# Patient Record
Sex: Male | Born: 1960 | Race: White | Hispanic: No | Marital: Married | Smoking: Former smoker
Health system: Southern US, Community
[De-identification: ages and names within clinical notes are randomized; demographics above are authoritative.]

## PROBLEM LIST (undated history)

## (undated) DIAGNOSIS — I10 Essential (primary) hypertension: Secondary | ICD-10-CM

## (undated) DIAGNOSIS — E538 Deficiency of other specified B group vitamins: Secondary | ICD-10-CM

## (undated) DIAGNOSIS — K219 Gastro-esophageal reflux disease without esophagitis: Secondary | ICD-10-CM

## (undated) DIAGNOSIS — E7800 Pure hypercholesterolemia, unspecified: Secondary | ICD-10-CM

## (undated) HISTORY — PX: TONSILLECTOMY: SUR1361

## (undated) HISTORY — PX: JOINT REPLACEMENT: SHX530

## (undated) HISTORY — PX: TOTAL HIP ARTHROPLASTY: SHX124

---

## 2005-10-25 ENCOUNTER — Ambulatory Visit: Payer: Self-pay | Admitting: Internal Medicine

## 2005-12-02 ENCOUNTER — Ambulatory Visit: Payer: Self-pay | Admitting: Psychiatry

## 2006-02-10 ENCOUNTER — Inpatient Hospital Stay: Payer: Self-pay | Admitting: General Practice

## 2007-01-31 ENCOUNTER — Ambulatory Visit: Payer: Self-pay | Admitting: Gastroenterology

## 2010-03-29 HISTORY — PX: CHOLECYSTECTOMY: SHX55

## 2010-04-29 ENCOUNTER — Ambulatory Visit: Payer: Self-pay

## 2010-05-06 ENCOUNTER — Ambulatory Visit: Payer: Self-pay | Admitting: Anesthesiology

## 2010-05-13 ENCOUNTER — Ambulatory Visit: Payer: Self-pay | Admitting: Surgery

## 2010-05-18 LAB — PATHOLOGY REPORT

## 2011-12-17 ENCOUNTER — Ambulatory Visit: Payer: Self-pay | Admitting: Family Medicine

## 2013-08-12 ENCOUNTER — Observation Stay: Payer: Self-pay | Admitting: Internal Medicine

## 2013-08-12 LAB — BASIC METABOLIC PANEL
ANION GAP: 5 — AB (ref 7–16)
BUN: 9 mg/dL (ref 7–18)
Calcium, Total: 8.9 mg/dL (ref 8.5–10.1)
Chloride: 109 mmol/L — ABNORMAL HIGH (ref 98–107)
Co2: 26 mmol/L (ref 21–32)
Creatinine: 0.94 mg/dL (ref 0.60–1.30)
EGFR (African American): >60
Glucose: 129 mg/dL — ABNORMAL HIGH (ref 65–99)
Osmolality: 280 (ref 275–301)
Potassium: 3.7 mmol/L (ref 3.5–5.1)
Sodium: 140 mmol/L (ref 136–145)

## 2013-08-12 LAB — CBC
HCT: 47.7 % (ref 40.0–52.0)
HGB: 16.1 g/dL (ref 13.0–18.0)
MCH: 30.3 pg (ref 26.0–34.0)
MCHC: 33.7 g/dL (ref 32.0–36.0)
MCV: 90 fL (ref 80–100)
Platelet: 211 10*3/uL (ref 150–440)
RBC: 5.31 10*6/uL (ref 4.40–5.90)
RDW: 13.5 % (ref 11.5–14.5)
WBC: 6.1 10*3/uL (ref 3.8–10.6)

## 2013-08-12 LAB — TROPONIN I
Troponin-I: <0.02 ng/mL
Troponin-I: <0.02 ng/mL

## 2013-08-12 LAB — HEPATIC FUNCTION PANEL A (ARMC)
ALK PHOS: 84 U/L
ALT: 83 U/L — AB (ref 12–78)
Albumin: 3.6 g/dL (ref 3.4–5.0)
Bilirubin, Direct: 0.1 mg/dL (ref 0.00–0.20)
Bilirubin,Total: 0.4 mg/dL (ref 0.2–1.0)
SGOT(AST): 42 U/L — ABNORMAL HIGH (ref 15–37)
TOTAL PROTEIN: 6.8 g/dL (ref 6.4–8.2)

## 2013-08-12 LAB — LIPASE, BLOOD: Lipase: 111 U/L (ref 73–393)

## 2013-08-12 LAB — CK-MB
CK-MB: 0.9 ng/mL (ref 0.5–3.6)
CK-MB: 0.7 ng/mL (ref 0.5–3.6)

## 2013-08-13 LAB — BASIC METABOLIC PANEL
Anion Gap: 1 — ABNORMAL LOW (ref 7–16)
BUN: 11 mg/dL (ref 7–18)
CALCIUM: 8.8 mg/dL (ref 8.5–10.1)
CO2: 32 mmol/L (ref 21–32)
Chloride: 106 mmol/L (ref 98–107)
Creatinine: 0.99 mg/dL (ref 0.60–1.30)
EGFR (African American): >60
EGFR (Non-African Amer.): >60
GLUCOSE: 95 mg/dL (ref 65–99)
Osmolality: 277 (ref 275–301)
POTASSIUM: 4.1 mmol/L (ref 3.5–5.1)
Sodium: 139 mmol/L (ref 136–145)

## 2013-08-13 LAB — CBC WITH DIFFERENTIAL/PLATELET
BASOS ABS: 0.1 10*3/uL (ref 0.0–0.1)
BASOS PCT: 0.8 %
EOS ABS: 0.3 10*3/uL (ref 0.0–0.7)
EOS PCT: 3.5 %
HCT: 43.8 % (ref 40.0–52.0)
HGB: 14.7 g/dL (ref 13.0–18.0)
LYMPHS ABS: 2.8 10*3/uL (ref 1.0–3.6)
LYMPHS PCT: 33.7 %
MCH: 30.2 pg (ref 26.0–34.0)
MCHC: 33.6 g/dL (ref 32.0–36.0)
MCV: 90 fL (ref 80–100)
Monocyte #: 0.8 x10 3/mm (ref 0.2–1.0)
Monocyte %: 10 %
Neutrophil #: 4.4 10*3/uL (ref 1.4–6.5)
Neutrophil %: 52 %
PLATELETS: 193 10*3/uL (ref 150–440)
RBC: 4.87 10*6/uL (ref 4.40–5.90)
RDW: 13.5 % (ref 11.5–14.5)
WBC: 8.4 10*3/uL (ref 3.8–10.6)

## 2014-07-20 NOTE — Discharge Summary (Signed)
PATIENT NAME:  Anthony Boyle, Anthony Boyle MR#:  [id number] DATE OF BIRTH:  [birth year]  DATE OF ADMISSION:  08/12/2013 DATE OF DISCHARGE:  08/13/2013  DISCHARGE DIAGNOSES: 1.  Atypical chest pain.  2.  Hypertensive urgency.  3.  Hyperlipidemia.  4.  Reflux esophagitis.   DISCHARGE MEDICATIONS: Simvastatin 40 mg at bedtime, Flonase 2 sprays daily, amlodipine 5 mg b.i.d., omeprazole 20 mg b.i.d., aspirin 81 mg daily.   REASON FOR ADMISSION: This 54 year old male presents with chest pain and elevated blood pressure. Please see H and P for HPI, past medical history, and physical exam.   HOSPITAL COURSE: The patient was admitted. Enzymes were negative. Lexiscan Myoview showed no signs of ischemia with normal ejection fraction. He was asymptomatic. He has been under a lot of stress lately at work. His systolic pressure has been running 160 to 170. The b.i.d. Norvasc has brought that down. He has been drinking about the same amount of alcohol. He will be doubling up the omeprazole and doubling up the amlodipine, to follow up with Dr. Sabra Heck in one week.   ____________________________ Rusty Aus, MD mfm:jcm D: 08/13/2013 13:53:48 ET T: 08/13/2013 15:28:09 ET JOB#: [id number]  cc: Rusty Aus, MD, <Dictator> MARK Roselee Culver MD ELECTRONICALLY SIGNED 08/14/2013 8:05

## 2014-07-20 NOTE — H&P (Signed)
PATIENT NAME:  Anthony Boyle, Anthony Boyle MR#:  [id number] DATE OF BIRTH:  Jul 23, 1960  DATE OF ADMISSION:  08/12/2013  REFERRING PHYSICIAN: Sheryl L. Benjaman Lobe, MD  FAMILY PHYSICIAN: Dr. [name].   REASON FOR ADMISSION: Chest pain.   HISTORY OF PRESENT ILLNESS: The patient is a 54 year old male with a history of benign hypertension, hyperlipidemia, reflux, previous hip surgery who presents to the Emergency Room with chest pain lasting an hour associated with weakness. His pain was described as tightness. It was nonradiating. No shortness of breath, nausea or palpitations. In the Emergency Room, the patient was given aspirin with improvement of his pain. EKG and initial cardiac enzymes are negative. He is now admitted for further evaluation.   PAST MEDICAL HISTORY: 1.  Benign hypertension.  2.  Hyperlipidemia.  3.  GE reflux disease.  4.  Environmental allergies.  5.  Status post left hip surgery.   MEDICATIONS: 1.  Simvastatin 40 mg p.o. daily.  2.  Omeprazole 20 mg p.o. daily.  3.  Flonase 2 puffs in each nostril daily.  4.  Norvasc 5 mg p.o. daily.   ALLERGIES: No known drug allergies.   SOCIAL HISTORY: The patient quit smoking over 10 years ago. No history of alcohol abuse.   FAMILY HISTORY: Noncontributory. Specifically negative for coronary artery disease, colon cancer or prostate cancer.   REVIEW OF SYSTEMS:    CONSTITUTIONAL: No fever or change in weight.  EYES: No blurred or double vision. No glaucoma.  ENT: No tinnitus or hearing loss. No nasal discharge or bleeding. No difficulty swallowing.  RESPIRATORY: No cough or wheezing. Denies hemoptysis.  CARDIOVASCULAR: No palpitations or syncope. No orthopnea.  GASTROINTESTINAL: No nausea, vomiting or diarrhea. No abdominal pain. No change in bowel habits.  GENITOURINARY: No dysuria or hematuria. No incontinence.  ENDOCRINE:  No polyuria or polydipsia.  No heat or cold intolerance.   HEMATOLOGIC:  The patient denies anemia, easy  bruising or bleeding. LYMPHATIC:  No swollen lands.   MUSCULOSKELETAL:  The patient denies pain in his neck, back, shoulders, knees or hips.  No gout.   NEUROLOGIC:  No numbness or migraines.  Denies stroke or seizures.   PSYCHIATRIC:  The patient denies anxiety, insomnia or depression.    PHYSICAL EXAMINATION: GENERAL:  The patient is anxious but in no acute distress.  VITAL SIGNS:  Remarkable for blood pressure 154/88, heart rate 77, respiratory rate 18, temperature 98, sat 98% on room air. HEENT:  Normocephalic, atraumatic.  Pupils equal, round and reactive to light and accommodation.  Extraocular movements are intact.  sclerae are anicteric.  Conjunctivae are clear.  Oropharynx is clear.   NECK:  Supple without JVD.  No lymphadenopathy or thyromegaly was noted. LUNGS:  Clear to auscultation and percussion without wheezing, rales, rhonchi.  No dullness.  Respiratory effort is normal.   CARDIAC EXAM:  Revealed a regular rate and rhythm with normal S1 and S2.  No significant rubs, murmurs or gallops.  PMI is nondisplaced.  Chest wall is nontender.   ABDOMEN:  Soft, nontender with normoactive bowel sounds.  No organomegaly or masses were appreciated.  No hernias or bruits were noted.   EXTREMITIES:  Without clubbing, cyanosis or edema.  Pulses were 2+ bilaterally.   SKIN:  Warm and dry without rash or lesions. NEUROLOGICAL EXAMINATION:  Cranial nerves II through XII grossly intact.  Deep tendon reflexes were symmetric.  Motor and sensory exams nonfocal. PSYCHIATRIC:  Revealed a patient who was alert and oriented to person, place  and time. He was cooperative and used good judgment.   LABORATORY, DIAGNOSTIC AND RADIOLOGICAL DATA:  EKG revealed sinus rhythm with no acute ischemic changes. Chest x-ray was negative. White count was 6.1 with a hemoglobin of 16.1. Glucose 129 with a BUN of 9, creatinine 0.94 and a GFR of greater than 60. Sodium 140 with a potassium of 3.7. Lipase 111. Troponin less than  0.02.   ASSESSMENT: 1.  Chest pain, worrisome for unstable angina.  2.  Benign hypertension.  3.  Hyperlipidemia.  4.  Anxiety.  5.  Hyperglycemia.  6.  Gastroesophageal reflux disease.    PLAN: The patient will be observed on telemetry with oral nitrates, aspirin, Lovenox and continued Norvasc. We will follow serial cardiac enzymes. Assuming his enzymes remain normal, we will proceed with stress Myoview testing in the morning. Further treatment and evaluation will depend upon the patient's progress.   TOTAL TIME SPENT ON THIS PATIENT: 45 minutes.   ____________________________ Leonie Douglas Doy Hutching, MD jds:cs D: 08/12/2013 14:52:02 ET T: 08/12/2013 14:59:42 ET JOB#: [id number]  cc: Leonie Douglas. Doy Hutching, MD, <Dictator> Rusty Aus, MD Pranav Lennice Sites MD ELECTRONICALLY SIGNED 08/12/2013 16:56

## 2015-07-24 IMAGING — CR DG CHEST 1V PORT
1 series · 1 of 1 positions shown · non-contrast
Comparison: None.

CLINICAL DATA: Chest pain

EXAM:
PORTABLE CHEST - 1 VIEW

[ap]
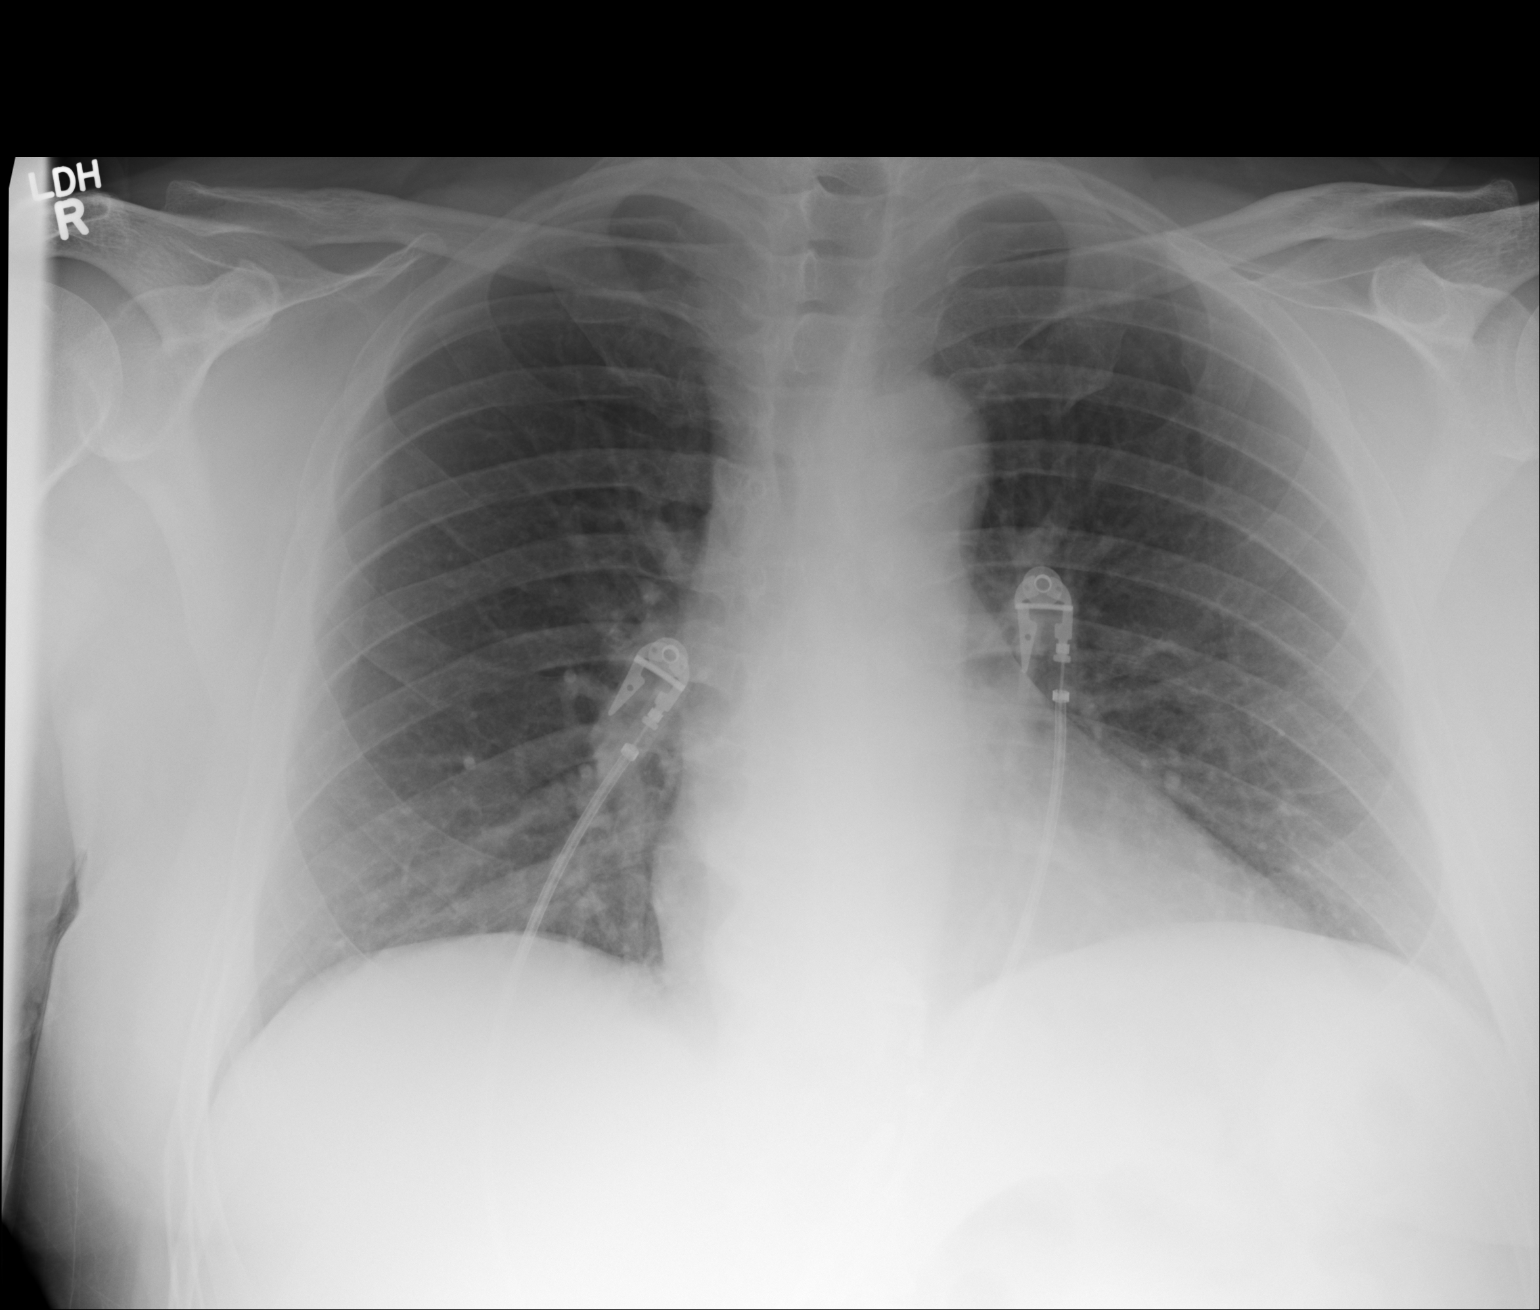

[1 of 1 positions shown; findings below may reference images not displayed]

FINDINGS: The heart size and mediastinal contours are within normal limits.
Both lungs are clear. The visualized skeletal structures are
unremarkable. Lungs are hypoaerated with crowding of the
bronchovascular markings. Cardiac leads obscure detail.
IMPRESSION: No active disease.

## 2016-09-23 ENCOUNTER — Emergency Department
Admission: EM | Admit: 2016-09-23 | Discharge: 2016-09-23 | Disposition: A | Discharge status: Home / Self Care | Payer: BLUE CROSS/BLUE SHIELD | Attending: Emergency Medicine | Admitting: Emergency Medicine

## 2016-09-23 ENCOUNTER — Encounter: Payer: Self-pay | Admitting: Emergency Medicine

## 2016-09-23 DIAGNOSIS — I10 Essential (primary) hypertension: Secondary | ICD-10-CM | POA: Diagnosis not present

## 2016-09-23 DIAGNOSIS — R42 Dizziness and giddiness: Secondary | ICD-10-CM

## 2016-09-23 DIAGNOSIS — R202 Paresthesia of skin: Secondary | ICD-10-CM | POA: Diagnosis not present

## 2016-09-23 DIAGNOSIS — E860 Dehydration: Secondary | ICD-10-CM | POA: Diagnosis not present

## 2016-09-23 DIAGNOSIS — Z87891 Personal history of nicotine dependence: Secondary | ICD-10-CM | POA: Insufficient documentation

## 2016-09-23 HISTORY — DX: Pure hypercholesterolemia, unspecified: E78.00

## 2016-09-23 HISTORY — DX: Gastro-esophageal reflux disease without esophagitis: K21.9

## 2016-09-23 HISTORY — DX: Essential (primary) hypertension: I10

## 2016-09-23 LAB — CBC
HCT: 45.6 % (ref 40.0–52.0)
Hemoglobin: 15.8 g/dL (ref 13.0–18.0)
MCH: 30.5 pg (ref 26.0–34.0)
MCHC: 34.6 g/dL (ref 32.0–36.0)
MCV: 88 fL (ref 80.0–100.0)
PLATELETS: 172 10*3/uL (ref 150–440)
RBC: 5.18 MIL/uL (ref 4.40–5.90)
RDW: 14.1 % (ref 11.5–14.5)
WBC: 7.3 10*3/uL (ref 3.8–10.6)

## 2016-09-23 LAB — URINALYSIS, COMPLETE (UACMP) WITH MICROSCOPIC
BILIRUBIN URINE: NEGATIVE
Bacteria, UA: NONE SEEN
Glucose, UA: NEGATIVE mg/dL
HGB URINE DIPSTICK: NEGATIVE
Ketones, ur: NEGATIVE mg/dL
LEUKOCYTES UA: NEGATIVE
NITRITE: NEGATIVE
PROTEIN: NEGATIVE mg/dL
RBC / HPF: NONE SEEN RBC/hpf (ref 0–5)
Specific Gravity, Urine: 1.002 — ABNORMAL LOW (ref 1.005–1.030)
WBC, UA: NONE SEEN WBC/hpf (ref 0–5)
pH: 6 (ref 5.0–8.0)

## 2016-09-23 LAB — TROPONIN I: Troponin I: <0.03 ng/mL (ref <0.03)

## 2016-09-23 LAB — BASIC METABOLIC PANEL
ANION GAP: 6 (ref 5–15)
BUN: 8 mg/dL (ref 6–20)
CALCIUM: 9.6 mg/dL (ref 8.9–10.3)
CO2: 27 mmol/L (ref 22–32)
CREATININE: 0.96 mg/dL (ref 0.61–1.24)
Chloride: 106 mmol/L (ref 101–111)
Glucose, Bld: 99 mg/dL (ref 65–99)
Potassium: 4.1 mmol/L (ref 3.5–5.1)
SODIUM: 139 mmol/L (ref 135–145)

## 2016-09-23 NOTE — Discharge Instructions (Signed)
Stay hydrated with gatorade. Try to stay out of the sun as much as possible.   You may have a pinch nerve in your neck. Avoid heavy lifting. Take motrin as needed for pain.   See your doctor in a week   Return to ER if you have worse numbness, weakness, dizziness, worsening rash, fever, vomiting.

## 2016-09-23 NOTE — ED Provider Notes (Signed)
[facility] Provider Note   CSN: [id number] Arrival date & time: 09/23/16  1043     History   Chief Complaint Chief Complaint  Patient presents with  . Tingling    HPI Anthony Boyle is a 56 y.o. male history of hypertension, reflux here presenting with dizziness, right arm tingling. Patient states that he was at the beach last week and hasn't been hydrated very well so sometimes get lightheaded and dizzy. 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 About 5 days ago, patient had a tick on the left buttock that he removed but didn't know how long the tick was on him. Over the last for 5 days he has intermittent dizziness as well as occasional paresthesias down the right arm. Denies any right arm weakness or slurred speech or leg weakness. Patient states that he has been drinking water and it helped with his symptoms and now he uses Gatorade and feels better. He shouldn't states that he gets tick bites multiple times a year and denies any expanding rash. Denies any fevers at home.  The history is provided by the patient.    Past Medical History:  Diagnosis Date  . Acid reflux   . Hypercholesteremia   . Hypertension     There are no active problems to display for this patient.   Past Surgical History:  Procedure Laterality Date  . TOTAL HIP ARTHROPLASTY         Home Medications    Prior to Admission medications   Not on File    Family History History reviewed. No pertinent family history.  Social History Social History  Substance Use Topics  . Smoking status: Former Research scientist (life sciences)  . Smokeless tobacco: Never Used  . Alcohol use Yes     Allergies   Patient has no known allergies.   Review of Systems Review of Systems  Neurological: Positive for dizziness and numbness.  All other systems reviewed and are negative.    Physical Exam Updated Vital Signs BP (!) 164/91 (BP Location: Left Arm)   Pulse 80   Temp 97.7 F (36.5 C)  (Oral)   Resp 16   Ht 5\' 9"  (1.753 m)   Wt 101.6 kg (224 lb)   SpO2 96%   BMI 33.08 kg/m   Physical Exam  Constitutional: He is oriented to person, place, and time. He appears well-developed and well-nourished.  HENT:  Head: Normocephalic.  Mouth/Throat: Oropharynx is clear and moist.  Eyes: Conjunctivae and EOM are normal. Pupils are equal, round, and reactive to light.  Neck: Normal range of motion. Neck supple.  No midline cervical or paracervical tenderness or deformity   Cardiovascular: Normal rate, regular rhythm and normal heart sounds.   Pulmonary/Chest: Effort normal and breath sounds normal. No respiratory distress. He has no wheezes.  Abdominal: Soft. Bowel sounds are normal. He exhibits no distension. There is no tenderness.  Musculoskeletal: Normal range of motion. He exhibits no edema or deformity.  Neurological: He is alert and oriented to person, place, and time.  CN 2-12 intact. Nl strength and sensation throughout. Nl gait   Skin: Skin is warm.  L buttock area with small rash, no central clearing. No obvious cellulitis.   Psychiatric: He has a normal mood and affect.  Nursing note and vitals reviewed.    ED Treatments / Results  Labs (all labs ordered are listed, but only abnormal results are displayed) Labs Reviewed  URINALYSIS, COMPLETE (UACMP) WITH MICROSCOPIC - Abnormal;  Notable for the following:       Result Value   Color, Urine STRAW (*)    APPearance CLEAR (*)    Specific Gravity, Urine 1.002 (*)    Squamous Epithelial / LPF 0-5 (*)    All other components within normal limits  BASIC METABOLIC PANEL  CBC  TROPONIN I    EKG  EKG Interpretation  Date/Time:  Thursday September 23 2016 11:25:00 EDT Ventricular Rate:  64 PR Interval:  170 QRS Duration: 84 QT Interval:  412 QTC Calculation: 425 R Axis:   -16 Text Interpretation:  Normal sinus rhythm Normal ECG When compared with ECG of 12-Aug-2013 10:46, No significant change was found Confirmed  by Wandra Arthurs [phone]) on 09/23/2016 1:05:34 PM       Radiology No results found.  Procedures Procedures (including critical care time)  Medications Ordered in ED Medications - No data to display   Initial Impression / Assessment and Plan / ED Course  I have reviewed the triage vital signs and the nursing notes.  Pertinent labs & imaging results that were available during my care of the patient were reviewed by me and considered in my medical decision making (see chart for details).     Anthony Boyle is a 56 y.o. male here with dizziness, R arm paresthesia. Nl neuro exam now. Appears mildly dehydrated. Borderline orthostatic. I think symptoms likely from mild dehydration vs cervical radiculopathy. No signs of stroke and neurologically intact. Of note, he did have a tick bite but no signs but I doubt RMSF or lyme disease rash. Labs unremarkable and UA showed no ketones. Recommend increase hydration with gatorade. Gave strict return precautions.    Final Clinical Impressions(s) / ED Diagnoses   Final diagnoses:  None    New Prescriptions New Prescriptions   No medications on file     Drenda Freeze, MD 09/23/16 1321

## 2016-09-23 NOTE — ED Triage Notes (Signed)
Pt c/o right arm tingling and numbness intermittent for 1 week.  Did remember pulling tick off him last week. No decraesed sensation noted in triage today.  Ambulatory to triage.  Has also been feeling lightheaded, mostly with change of position.  VSS

## 2018-02-14 ENCOUNTER — Encounter: Payer: Self-pay | Admitting: *Deleted

## 2018-02-15 ENCOUNTER — Encounter: Payer: Self-pay | Admitting: *Deleted

## 2018-02-15 ENCOUNTER — Encounter
Admission: RE | Disposition: A | Discharge status: Home / Self Care | Payer: Self-pay | Source: Ambulatory Visit | Attending: Internal Medicine

## 2018-02-15 ENCOUNTER — Ambulatory Visit: Payer: BLUE CROSS/BLUE SHIELD | Admitting: Certified Registered Nurse Anesthetist

## 2018-02-15 ENCOUNTER — Ambulatory Visit
Admission: RE | Admit: 2018-02-15 | Discharge: 2018-02-15 | Disposition: A | Discharge status: Home / Self Care | Payer: BLUE CROSS/BLUE SHIELD | Source: Ambulatory Visit | Attending: Internal Medicine | Admitting: Internal Medicine

## 2018-02-15 ENCOUNTER — Other Ambulatory Visit: Payer: Self-pay

## 2018-02-15 DIAGNOSIS — Z7951 Long term (current) use of inhaled steroids: Secondary | ICD-10-CM | POA: Insufficient documentation

## 2018-02-15 DIAGNOSIS — Z79899 Other long term (current) drug therapy: Secondary | ICD-10-CM | POA: Insufficient documentation

## 2018-02-15 DIAGNOSIS — K219 Gastro-esophageal reflux disease without esophagitis: Secondary | ICD-10-CM | POA: Insufficient documentation

## 2018-02-15 DIAGNOSIS — Z87891 Personal history of nicotine dependence: Secondary | ICD-10-CM | POA: Diagnosis not present

## 2018-02-15 DIAGNOSIS — Z7982 Long term (current) use of aspirin: Secondary | ICD-10-CM | POA: Diagnosis not present

## 2018-02-15 DIAGNOSIS — R195 Other fecal abnormalities: Secondary | ICD-10-CM | POA: Diagnosis present

## 2018-02-15 DIAGNOSIS — E538 Deficiency of other specified B group vitamins: Secondary | ICD-10-CM | POA: Diagnosis not present

## 2018-02-15 DIAGNOSIS — E7800 Pure hypercholesterolemia, unspecified: Secondary | ICD-10-CM | POA: Insufficient documentation

## 2018-02-15 DIAGNOSIS — D123 Benign neoplasm of transverse colon: Secondary | ICD-10-CM | POA: Insufficient documentation

## 2018-02-15 DIAGNOSIS — I10 Essential (primary) hypertension: Secondary | ICD-10-CM | POA: Diagnosis not present

## 2018-02-15 DIAGNOSIS — D125 Benign neoplasm of sigmoid colon: Secondary | ICD-10-CM | POA: Diagnosis not present

## 2018-02-15 DIAGNOSIS — K640 First degree hemorrhoids: Secondary | ICD-10-CM | POA: Diagnosis not present

## 2018-02-15 DIAGNOSIS — Z96642 Presence of left artificial hip joint: Secondary | ICD-10-CM | POA: Insufficient documentation

## 2018-02-15 HISTORY — PX: COLONOSCOPY WITH PROPOFOL: SHX5780

## 2018-02-15 HISTORY — DX: Deficiency of other specified B group vitamins: E53.8

## 2018-02-15 SURGERY — COLONOSCOPY WITH PROPOFOL
Anesthesia: General

## 2018-02-15 MED ORDER — PROPOFOL 10 MG/ML IV BOLUS
INTRAVENOUS | Status: DC | PRN
  Administered 2018-02-15: 80 mg via INTRAVENOUS

## 2018-02-15 MED ORDER — SODIUM CHLORIDE 0.9 % IV SOLN
INTRAVENOUS | Status: DC
  Administered 2018-02-15: 11:00:00 via INTRAVENOUS

## 2018-02-15 MED ORDER — PROPOFOL 500 MG/50ML IV EMUL
INTRAVENOUS | Status: DC | PRN
  Administered 2018-02-15: 160 ug/kg/min via INTRAVENOUS

## 2018-02-15 NOTE — Anesthesia Preprocedure Evaluation (Signed)
Anesthesia Evaluation  Patient identified by MRN, date of birth, ID band Patient awake    Reviewed: Allergy & Precautions, H&P , NPO status , reviewed documented beta blocker date and time   Airway Mallampati: II   Neck ROM: full    Dental  (+) Chipped   Pulmonary former smoker,    Pulmonary exam normal        Cardiovascular hypertension, Normal cardiovascular exam  Summary   1. No significant wall motion abnormality noted.   2. Pharmacological myocardial perfusion study with no significant  ischemia.   3. The estimated ejection fraction is 67%.   4. The left ventricular global function was normal.   5. There are no EKG changes concerning for ischemia.   6. There is no artifact noted on this study.  Diagnosing Physician: [id number] Serafina Royals MD  Electronically signed at 1:20:53 PM on 08/13/2013    Neuro/Psych    GI/Hepatic GERD  Medicated and Controlled,  Endo/Other    Renal/GU      Musculoskeletal   Abdominal   Peds  Hematology   Anesthesia Other Findings Past Medical History: No date: Acid reflux No date: B12 deficiency No date: Hypercholesteremia No date: Hypertension  Past Surgical History: 2012: CHOLECYSTECTOMY No date: JOINT REPLACEMENT; Left     Comment:  replacement total hip with resurfacing implants No date: TONSILLECTOMY No date: TOTAL HIP ARTHROPLASTY  BMI    Body Mass Index:  33.97 kg/m      Reproductive/Obstetrics                             Anesthesia Physical Anesthesia Plan  ASA: II  Anesthesia Plan: General   Post-op Pain Management:    Induction: Intravenous  PONV Risk Score and Plan: 2 and Treatment may vary due to age or medical condition and TIVA  Airway Management Planned: Nasal Cannula and Natural Airway  Additional Equipment:   Intra-op Plan:   Post-operative Plan:   Informed Consent: I have reviewed the patients History and Physical,  chart, labs and discussed the procedure including the risks, benefits and alternatives for the proposed anesthesia with the patient or authorized representative who has indicated his/her understanding and acceptance.   Dental Advisory Given  Plan Discussed with: CRNA  Anesthesia Plan Comments:         Anesthesia Quick Evaluation

## 2018-02-15 NOTE — H&P (Signed)
  Outpatient short stay form Pre-procedure 02/15/2018 11:01 AM Anthony Boyle, M.D.  Primary Physician: [name], MD  Reason for visit: Positive Cologuard stool testing  History of present illness: Patient is a pleasant 57 year old male presenting for colorectal cancer screening due to a positive stool Cologuard test.  The patient denies any abdominal pain, change in bowel habits or rectal bleeding.  Last colonoscopy 2008 with Dr. Rhona Leavens revealed internal hemorrhoids but no intracolonic neoplasms.    Current Facility-Administered Medications:  .  0.9 %  sodium chloride infusion, , Intravenous, Continuous, [place], Benay Pike, MD, Last Rate: 20 mL/hr at 02/15/18 1034  Medications Prior to Admission  Medication Sig Dispense Refill Last Dose  . amLODipine (NORVASC) 10 MG tablet Take 10 mg by mouth daily.   02/14/2018 at 1900  . aspirin EC 81 MG tablet Take 81 mg by mouth daily.   Past Week at Unknown time  . fluticasone (FLONASE) 50 MCG/ACT nasal spray Place 2 sprays into both nostrils daily.   Past Week at Unknown time  . olmesartan (BENICAR) 20 MG tablet Take 20 mg by mouth daily. Take at night   02/14/2018 at 1900  . omeprazole (PRILOSEC) 20 MG capsule Take 20 mg by mouth daily.   02/14/2018 at 0800  . simvastatin (ZOCOR) 40 MG tablet Take 40 mg by mouth daily. Take at night   02/14/2018 at 1900  . vitamin B-12 (CYANOCOBALAMIN) 1000 MCG tablet Take 1,000 mcg by mouth daily.   Past Week at Unknown time     No Known Allergies   Past Medical History:  Diagnosis Date  . Acid reflux   . B12 deficiency   . Hypercholesteremia   . Hypertension     Review of systems:  Otherwise negative.    Physical Exam  Gen: Alert, oriented. Appears stated age.  HEENT: [place]/AT. PERRLA. Lungs: CTA, no wheezes. CV: RR nl S1, S2. Abd: soft, benign, no masses. BS+ Ext: No edema. Pulses 2+    Planned procedures: Proceed with colonoscopy. The patient understands the nature of the planned  procedure, indications, risks, alternatives and potential complications including but not limited to bleeding, infection, perforation, damage to internal organs and possible oversedation/side effects from anesthesia. The patient agrees and gives consent to proceed.  Please refer to procedure notes for findings, recommendations and patient disposition/instructions.     Anthony Boyle, M.D. Gastroenterology 02/15/2018  11:01 AM

## 2018-02-15 NOTE — Transfer of Care (Signed)
Immediate Anesthesia Transfer of Care Note  Patient: Anthony Boyle  Procedure(s) Performed: COLONOSCOPY WITH PROPOFOL (N/A )  Patient Location: PACU and Endoscopy Unit  Anesthesia Type:General  Level of Consciousness: drowsy  Airway & Oxygen Therapy: Patient Spontanous Breathing and Patient connected to nasal cannula oxygen  Post-op Assessment: Report given to RN and Post -op Vital signs reviewed and stable  Post vital signs: Reviewed and stable  Last Vitals:  Vitals Value Taken Time  BP 106/73   Temp    Pulse    Resp    SpO2      Last Pain:  Vitals:   02/15/18 1142  TempSrc: (P) Tympanic  PainSc:          Complications: No apparent anesthesia complications

## 2018-02-15 NOTE — Anesthesia Postprocedure Evaluation (Signed)
Anesthesia Post Note  Patient: Anthony Boyle  Procedure(s) Performed: COLONOSCOPY WITH PROPOFOL (N/A )  Patient location during evaluation: Endoscopy Anesthesia Type: General Level of consciousness: awake and alert Pain management: pain level controlled Vital Signs Assessment: post-procedure vital signs reviewed and stable Respiratory status: spontaneous breathing, nonlabored ventilation and respiratory function stable Cardiovascular status: blood pressure returned to baseline and stable Postop Assessment: no apparent nausea or vomiting Anesthetic complications: no     Last Vitals:  Vitals:   02/15/18 1202 02/15/18 1210  BP: 124/64 132/90  Pulse: 78   Resp:    Temp:    SpO2: 98% 97%    Last Pain:  Vitals:   02/15/18 1210  TempSrc:   PainSc: 0-No pain                 Alphonsus Sias

## 2018-02-15 NOTE — Anesthesia Procedure Notes (Signed)
Performed by: Clairissa Valvano, CRNA Pre-anesthesia Checklist: Patient identified, Emergency Drugs available, Suction available, Patient being monitored and Timeout performed Patient Re-evaluated:Patient Re-evaluated prior to induction Oxygen Delivery Method: Nasal cannula Induction Type: IV induction       

## 2018-02-15 NOTE — Interval H&P Note (Signed)
History and Physical Interval Note:  02/15/2018 11:03 AM  Anthony Boyle  has presented today for surgery, with the diagnosis of + COLORECTAL CANCER SCREEN  The various methods of treatment have been discussed with the patient and family. After consideration of risks, benefits and other options for treatment, the patient has consented to  Procedure(s): COLONOSCOPY WITH PROPOFOL (N/A) as a surgical intervention .  The patient's history has been reviewed, patient examined, no change in status, stable for surgery.  I have reviewed the patient's chart and labs.  Questions were answered to the patient's satisfaction.     [name], [name]

## 2018-02-15 NOTE — Op Note (Addendum)
[facility] Patient Name: Anthony Boyle Procedure Date: 02/15/2018 11:05 AM MRN: [id number] Account #: [id number] Date of Birth: October 18, 1960 Admit Type: Outpatient Age: 57 Room: [facility] ENDO ROOM 3 Gender: Male Note Status: Finalized Procedure:            Colonoscopy Indications:          Positive Cologuard test Providers:            Benay Pike. Alice Reichert MD, MD Referring MD:         Rusty Aus, MD (Referring MD) Medicines:            Propofol per Anesthesia Complications:        No immediate complications. Procedure:            Pre-Anesthesia Assessment:                       - The risks and benefits of the procedure and the                        sedation options and risks were discussed with the                        patient. All questions were answered and informed                        consent was obtained.                       - Patient identification and proposed procedure were                        verified prior to the procedure by the nurse. The                        procedure was verified in the procedure room.                       - ASA Grade Assessment: III - A patient with severe                        systemic disease.                       - After reviewing the risks and benefits, the patient                        was deemed in satisfactory condition to undergo the                        procedure.                       After obtaining informed consent, the colonoscope was                        passed under direct vision. Throughout the procedure,                        the patient's blood pressure, pulse, and oxygen  saturations were monitored continuously. The                        Colonoscope was introduced through the anus and                        advanced to the the cecum, identified by appendiceal                        orifice and ileocecal valve. The colonoscopy was   performed without difficulty. The patient tolerated the                        procedure well. The quality of the bowel preparation                        was good. The ileocecal valve, appendiceal orifice, and                        rectum were photographed. Findings:      The perianal and digital rectal examinations were normal. Pertinent       negatives include normal sphincter tone and no palpable rectal lesions.      Three semi-pedunculated polyps were found in the sigmoid colon, distal       sigmoid colon and hepatic flexure. The polyps were 4 to 6 mm in size.       These polyps were removed with a cold snare. Resection and retrieval       were complete.      Two pedunculated polyps were found in the mid sigmoid colon and distal       sigmoid colon. The polyps were 5 to 8 mm in size. These polyps were       removed with a hot snare. Resection and retrieval were complete. To       prevent bleeding after the polypectomy, one hemostatic clip was       successfully placed (MR conditional). There was no bleeding during, or       at the end, of the procedure.      Non-bleeding internal hemorrhoids were found during retroflexion. The       hemorrhoids were Grade I (internal hemorrhoids that do not prolapse).      The exam was otherwise without abnormality. Impression:           - Three 4 to 6 mm polyps in the sigmoid colon, in the                        distal sigmoid colon and at the hepatic flexure,                        removed with a cold snare. Resected and retrieved.                       - Two 5 to 8 mm polyps in the mid sigmoid colon and in                        the distal sigmoid colon, removed with a hot snare.                        Resected and  retrieved. Clip (MR conditional) was                        placed.                       - Non-bleeding internal hemorrhoids.                       - The examination was otherwise normal. Recommendation:       - Patient has a contact  number available for                        emergencies. The signs and symptoms of potential                        delayed complications were discussed with the patient.                        Return to normal activities tomorrow. Written discharge                        instructions were provided to the patient.                       - Resume previous diet.                       - Continue present medications.                       - Repeat colonoscopy is recommended for surveillance.                        The colonoscopy date will be determined after pathology                        results from today's exam become available for review.                       - Return to GI office PRN.                       - The findings and recommendations were discussed with                        the patient and their family. Procedure Code(s):    --- Professional ---                       [phone], Colonoscopy, flexible; with removal of tumor(s),                        polyp(s), or other lesion(s) by snare technique Diagnosis Code(s):    --- Professional ---                       R19.5, Other fecal abnormalities                       K64.0, First degree hemorrhoids                       D12.5, Benign neoplasm of  sigmoid colon                       D12.3, Benign neoplasm of transverse colon (hepatic                        flexure or splenic flexure) CPT copyright 2018 American Medical Association. All rights reserved. The codes documented in this report are preliminary and upon coder review may  be revised to meet current compliance requirements. Efrain Sella MD, MD 02/15/2018 11:44:39 AM This report has been signed electronically. Number of Addenda: 0 Note Initiated On: 02/15/2018 11:05 AM Scope Withdrawal Time: 0 hours 18 minutes 51 seconds  Total Procedure Duration: 0 hours 25 minutes 29 seconds       [facility]

## 2018-02-15 NOTE — Anesthesia Post-op Follow-up Note (Signed)
Anesthesia QCDR form completed.        

## 2018-02-16 LAB — SURGICAL PATHOLOGY

## 2018-02-17 ENCOUNTER — Encounter: Payer: Self-pay | Admitting: Internal Medicine

## 2022-10-12 ENCOUNTER — Ambulatory Visit: Payer: BLUE CROSS/BLUE SHIELD

## 2022-10-12 DIAGNOSIS — K2970 Gastritis, unspecified, without bleeding: Secondary | ICD-10-CM | POA: Diagnosis not present

## 2022-10-12 DIAGNOSIS — K2289 Other specified disease of esophagus: Secondary | ICD-10-CM | POA: Diagnosis not present

## 2022-10-12 DIAGNOSIS — K219 Gastro-esophageal reflux disease without esophagitis: Secondary | ICD-10-CM | POA: Diagnosis not present

## 2022-12-09 ENCOUNTER — Other Ambulatory Visit: Payer: Self-pay | Admitting: Cardiology

## 2022-12-09 DIAGNOSIS — R0609 Other forms of dyspnea: Secondary | ICD-10-CM

## 2022-12-09 DIAGNOSIS — I10 Essential (primary) hypertension: Secondary | ICD-10-CM

## 2022-12-09 DIAGNOSIS — Z87891 Personal history of nicotine dependence: Secondary | ICD-10-CM

## 2022-12-09 DIAGNOSIS — E782 Mixed hyperlipidemia: Secondary | ICD-10-CM

## 2022-12-14 ENCOUNTER — Telehealth (HOSPITAL_COMMUNITY): Payer: Self-pay | Admitting: Emergency Medicine

## 2022-12-14 NOTE — Telephone Encounter (Signed)
Reaching out to patient to offer assistance regarding upcoming cardiac imaging study; pt verbalizes understanding of appt date/time, parking situation and where to check in, pre-test NPO status and medications ordered, and verified current allergies; name and call back number provided for further questions should they arise Cayne Yom RN Navigator Cardiac Imaging [facility] [phone] office [phone] cell 

## 2022-12-15 ENCOUNTER — Ambulatory Visit
Admission: RE | Admit: 2022-12-15 | Discharge: 2022-12-15 | Disposition: A | Discharge status: Home / Self Care | Payer: BC Managed Care – PPO | Source: Ambulatory Visit | Attending: Cardiology | Admitting: Cardiology

## 2022-12-15 DIAGNOSIS — Z87891 Personal history of nicotine dependence: Secondary | ICD-10-CM

## 2022-12-15 DIAGNOSIS — E782 Mixed hyperlipidemia: Secondary | ICD-10-CM | POA: Diagnosis present

## 2022-12-15 DIAGNOSIS — R0609 Other forms of dyspnea: Secondary | ICD-10-CM

## 2022-12-15 DIAGNOSIS — I10 Essential (primary) hypertension: Secondary | ICD-10-CM | POA: Diagnosis present

## 2022-12-15 MED ORDER — NITROGLYCERIN 0.4 MG SL SUBL
SUBLINGUAL_TABLET | SUBLINGUAL | Status: AC
  Filled 2022-12-15: qty 1

## 2022-12-15 MED ORDER — NITROGLYCERIN 0.4 MG SL SUBL
0.8000 mg | SUBLINGUAL_TABLET | Freq: Once | SUBLINGUAL | Status: AC
  Administered 2022-12-15: 0.8 mg via SUBLINGUAL
  Filled 2022-12-15: qty 25

## 2022-12-15 MED ORDER — IOHEXOL 350 MG/ML SOLN
80.0000 mL | Freq: Once | INTRAVENOUS | Status: AC | PRN
  Administered 2022-12-15: 80 mL via INTRAVENOUS

## 2022-12-15 NOTE — Progress Notes (Signed)
Pt tolerated procedure well with no issues. Pt ABCs intact. Pt denies any complaints. Pt encouraged to drink plenty of water throughout the day. Pt ambulatory with steady gait.

## 2023-01-16 ENCOUNTER — Encounter: Payer: Self-pay | Admitting: Emergency Medicine

## 2023-01-16 ENCOUNTER — Emergency Department: Payer: BC Managed Care – PPO

## 2023-01-16 ENCOUNTER — Ambulatory Visit
Admission: EM | Admit: 2023-01-16 | Discharge: 2023-01-16 | Disposition: A | Discharge status: Home / Self Care | Payer: BC Managed Care – PPO

## 2023-01-16 ENCOUNTER — Other Ambulatory Visit: Payer: Self-pay

## 2023-01-16 ENCOUNTER — Emergency Department
Admission: EM | Admit: 2023-01-16 | Discharge: 2023-01-16 | Disposition: A | Discharge status: Home / Self Care | Payer: BC Managed Care – PPO | Attending: Emergency Medicine | Admitting: Emergency Medicine

## 2023-01-16 DIAGNOSIS — M79662 Pain in left lower leg: Secondary | ICD-10-CM

## 2023-01-16 DIAGNOSIS — I10 Essential (primary) hypertension: Secondary | ICD-10-CM | POA: Diagnosis not present

## 2023-01-16 DIAGNOSIS — M79605 Pain in left leg: Secondary | ICD-10-CM | POA: Insufficient documentation

## 2023-01-16 DIAGNOSIS — M7989 Other specified soft tissue disorders: Secondary | ICD-10-CM

## 2023-01-16 NOTE — ED Provider Notes (Signed)
   [facility] Provider Note    Event Date/Time   First MD Initiated Contact with Patient 01/16/23 1014     (approximate)  History   Chief Complaint: Leg Pain  HPI  Anthony Boyle is a 62 y.o. male with a past medical history of hypertension, hyperlipidemia, presents to the emergency department for left leg discomfort.  According to the patient over the past 4 days or so he has noted discomfort to the back of his left calf where there is a tender swollen area.  No history of DVT or PE previously.  Patient states he has had slight symptoms similar to this in the past have gone away on their own but this 1 does not seem to be improving in fact it appears to be worsening.  Patient denies any chest pain or shortness of breath.  No trauma.  No anticoagulation besides baby aspirin.  Physical Exam   Triage Vital Signs: ED Triage Vitals  Encounter Vitals Group     BP 01/16/23 0857 (!) 159/81     Systolic BP Percentile --      Diastolic BP Percentile --      Pulse Rate 01/16/23 0857 71     Resp 01/16/23 0857 20     Temp 01/16/23 0857 98.1 F (36.7 C)     Temp Source 01/16/23 0857 Oral     SpO2 01/16/23 0857 97 %     Weight 01/16/23 0846 240 lb (108.9 kg)     Height 01/16/23 0846 5\' 7"  (1.702 m)     Head Circumference --      Peak Flow --      Pain Score 01/16/23 0846 5     Pain Loc --      Pain Education --      Exclude from Growth Chart --     Most recent vital signs: Vitals:   01/16/23 0857  BP: (!) 159/81  Pulse: 71  Resp: 20  Temp: 98.1 F (36.7 C)  SpO2: 97%    General: Awake, no distress.  CV:  Good peripheral perfusion.  Regular rate and rhythm  Resp:  Normal effort.  Equal breath sounds bilaterally.  Abd:  No distention. Other:  Patient has a mild to moderately tender area to the posterior calf that is slightly warm to the touch and indurated.  Concerning for possible hematoma versus superficial thrombophlebitis versus DVT.   ED  Results / Procedures / Treatments   RADIOLOGY  Patient's ultrasound is negative for DVT.   MEDICATIONS ORDERED IN ED: Medications - No data to display   IMPRESSION / MDM / ASSESSMENT AND PLAN / ED COURSE  I reviewed the triage vital signs and the nursing notes.  Patient's presentation is most consistent with acute presentation with potential threat to life or bodily function.  Patient has a warm and tender area to the left posterior calf.  No trauma.  We will obtain an ultrasound to further evaluate.  Clinically appears concerning for superficial thrombophlebitis versus hematoma versus DVT.  Neurovascularly intact distally.  Ultrasound is negative for DVT.  Discussed with the patient to continue warm compresses and baby aspirin.  Follow-up with his doctor in 1 week for recheck.  Patient agreeable plan of care.  FINAL CLINICAL IMPRESSION(S) / ED DIAGNOSES   Left leg pain   Note:  This document was prepared using Dragon voice recognition software and may include unintentional dictation errors.   Minna Antis, MD 01/16/23 1104

## 2023-01-16 NOTE — Discharge Instructions (Addendum)
As we discussed please continue to use warm compresses to your left leg, keep it elevated if you are able and continue to take an 81 mg aspirin daily.  Please follow-up with your doctor in 1 week for recheck/reevaluation.  Return to the emergency department for any worsening pain any chest pain or any trouble breathing.

## 2023-01-16 NOTE — Discharge Instructions (Signed)
Go to ER for evaluation of unilateral left leg swelling, calf pain, worsening x 5 days, need to r/o DVT.

## 2023-01-16 NOTE — ED Provider Notes (Signed)
Renaldo Fiddler    CSN: [id number] Arrival date & time: 01/16/23  0865      History   Chief Complaint Chief Complaint  Patient presents with   Leg Pain    HPI LOTANNA PISCITELLI is a 62 y.o. male.   62 year old male pt, Leverne Coriell, presents to urgent care for evaluation of left sided lower leg pain with swelling and redness. Pt states symptoms started ~ 5 days prior, no known injury, pain worse with standing.   Past Medical History:Acid reflux, B12 deficiency,Hypercholesteremia Hypertension    The history is provided by the patient. No language interpreter was used.    Past Medical History:  Diagnosis Date   Acid reflux    B12 deficiency    Hypercholesteremia    Hypertension     Patient Active Problem List   Diagnosis Date Noted   Pain and swelling of left lower leg 01/16/2023    Past Surgical History:  Procedure Laterality Date   CHOLECYSTECTOMY  2012   COLONOSCOPY WITH PROPOFOL N/A 02/15/2018   Procedure: COLONOSCOPY WITH PROPOFOL;  Surgeon: Toledo, Boykin Nearing, MD;  Location: [facility] ENDOSCOPY;  Service: Gastroenterology;  Laterality: N/A;   JOINT REPLACEMENT Left    replacement total hip with resurfacing implants   TONSILLECTOMY     TOTAL HIP ARTHROPLASTY         Home Medications    Prior to Admission medications   Medication Sig Start Date End Date Taking? Authorizing Provider  esomeprazole (NEXIUM) 40 MG capsule Take 1 capsule by mouth daily. 01/05/23  Yes [provider]  furosemide (LASIX) 20 MG tablet Take by mouth. 01/05/23 01/05/24 Yes [provider]  isosorbide mononitrate (IMDUR) 30 MG 24 hr tablet Take 30 mg by mouth daily. 12/09/22  Yes [provider]  metoprolol tartrate (LOPRESSOR) 25 MG tablet Take 25 mg by mouth 2 (two) times daily. 12/09/22  Yes [provider]  VOQUEZNA 20 MG TABS Take by mouth. 01/05/23  Yes [provider]  amLODipine (NORVASC) 10 MG tablet Take 10 mg by mouth daily.     [provider]  aspirin EC 81 MG tablet Take 81 mg by mouth daily.    [provider]  fluticasone (FLONASE) 50 MCG/ACT nasal spray Place 2 sprays into both nostrils daily.    [provider]  olmesartan (BENICAR) 20 MG tablet Take 20 mg by mouth daily. Take at night Patient not taking: Reported on 01/16/2023    [provider]  omeprazole (PRILOSEC) 20 MG capsule Take 20 mg by mouth daily.    [provider]  simvastatin (ZOCOR) 40 MG tablet Take 40 mg by mouth daily. Take at night    [provider]  vitamin B-12 (CYANOCOBALAMIN) 1000 MCG tablet Take 1,000 mcg by mouth daily.    [provider]    Family History History reviewed. No pertinent family history.  Social History Social History   Tobacco Use   Smoking status: Former   Smokeless tobacco: Never  Vaping Use   Vaping status: Never Used  Substance Use Topics   Alcohol use: Yes    Alcohol/week: 24.0 standard drinks of alcohol    Types: 24 Cans of beer per week   Drug use: No     Allergies   Patient has no known allergies.   Review of Systems Review of Systems  Constitutional:  Negative for fever.  Cardiovascular:  Positive for leg swelling. Negative for chest pain and palpitations.  Musculoskeletal:  Positive for myalgias.  Skin:  Positive for color change. Negative for wound.  All other systems reviewed and are negative.    Physical Exam Triage Vital Signs ED Triage Vitals  Encounter Vitals Group     BP 01/16/23 0814 (!) 156/93     Systolic BP Percentile --      Diastolic BP Percentile --      Pulse Rate 01/16/23 0814 85     Resp 01/16/23 0814 18     Temp 01/16/23 0814 97.9 F (36.6 C)     Temp src --      SpO2 01/16/23 0814 97 %     Weight --      Height --      Head Circumference --      Peak Flow --      Pain Score 01/16/23 0812 6     Pain Loc --      Pain Education --      Exclude from Growth Chart --    No data  found.  Updated Vital Signs BP (!) 156/93   Pulse 85   Temp 97.9 F (36.6 C)   Resp 18   SpO2 97%   Visual Acuity Right Eye Distance:   Left Eye Distance:   Bilateral Distance:    Right Eye Near:   Left Eye Near:    Bilateral Near:     Physical Exam Vitals and nursing note reviewed.  Constitutional:      Appearance: Normal appearance. He is well-developed and well-groomed.  HENT:     Head: Normocephalic.  Cardiovascular:     Rate and Rhythm: Normal rate and regular rhythm.     Pulses:          Dorsalis pedis pulses are 2+ on the left side.  Pulmonary:     Effort: Pulmonary effort is normal.  Musculoskeletal:     Comments: +Left homan's sign  Skin:    General: Skin is warm.     Capillary Refill: Capillary refill takes less than 2 seconds.     Findings: Erythema present.       Neurological:     General: No focal deficit present.     Mental Status: He is alert and oriented to person, place, and time.     Cranial Nerves: No cranial nerve deficit.     Sensory: No sensory deficit.  Psychiatric:        Behavior: Behavior is cooperative.      UC Treatments / Results  Labs (all labs ordered are listed, but only abnormal results are displayed) Labs Reviewed - No data to display  EKG   Radiology No results found.  Procedures Procedures (including critical care time)  Medications Ordered in UC Medications - No data to display  Initial Impression / Assessment and Plan / UC Course  I have reviewed the triage vital signs and the nursing notes.  Pertinent labs & imaging results that were available during my care of the patient were reviewed by me and considered in my medical decision making (see chart for details).     Ddx: Left leg swelling, DVT, phlebitis, cellulitis  Final Clinical Impressions(s) / UC Diagnoses   Final diagnoses:  Pain and swelling of left lower leg     Discharge Instructions      Go to ER for evaluation of unilateral left leg  swelling, calf pain, worsening x 5 days, need to r/o DVT.      ED Prescriptions  None    PDMP not reviewed this encounter.   Clancy Gourd, NP 01/16/23 [phone]

## 2023-01-16 NOTE — ED Triage Notes (Addendum)
Patient to Urgent Care with complaints of left sided lower leg pain w/ swelling and redness. Describes a constant burning pain.  Symptoms started approx 5 days ago. Denies any known injury. Gradual onset of symptoms. Pain worse when standing.

## 2023-01-16 NOTE — ED Triage Notes (Signed)
Pt reports pain to left leg sine Wednesday. Pain is located on the backside. Pt went to UC and was advised to come to the ED to rule out a blood clot. Denies SOB or injuries

## 2023-01-16 NOTE — ED Notes (Signed)
Patient is being discharged from the Urgent Care and sent to the Emergency Department via POV . Per Clancy Gourd NP, patient is in need of higher level of care due to pain and swelling of left lower leg. Patient is aware and verbalizes understanding of plan of care.  Vitals:   01/16/23 0814  BP: (!) 156/93  Pulse: 85  Resp: 18  Temp: 97.9 F (36.6 C)  SpO2: 97%

## 2023-01-16 NOTE — ED Provider Triage Note (Signed)
Emergency Medicine Provider Triage Evaluation Note  Anthony Boyle , a 62 y.o. male  was evaluated in triage.  Pt complains of left leg pain that started Thursday.  Patient denies any prior DVT, recent injury, smoking or shortness of breath.  Worse in the mornings with pain and swelling.  Has taken tylenol without improvement.    Review of Systems  Positive: Left lower leg pain Negative: No risk factors per patient  Physical Exam  BP (!) 159/81 (BP Location: Left Arm)   Pulse 71   Temp 98.1 F (36.7 C) (Oral)   Resp 20   Ht 5\' 7"  (1.702 m)   Wt 108.9 kg   SpO2 97%   BMI 37.59 kg/m  Gen:   Awake, no distress   Resp:  Normal effort  Lungs clear bilaterally. MSK:   Moves extremities without difficulty.  Moderate tenderness to medial aspect of calf posteriorly.  Skin intact.  Holman's sign grossly positive.   Other:    Medical Decision Making  Medically screening exam initiated at 8:59 AM.  Appropriate orders placed.  Muneer Gurganious Myszka was informed that the remainder of the evaluation will be completed by another provider, this initial triage assessment does not replace that evaluation, and the importance of remaining in the ED until their evaluation is complete.     Tommi Rumps, PA-C 01/16/23 1319
